# Patient Record
Sex: Male | Born: 1960 | Race: White | Hispanic: No | Marital: Married
Health system: Southern US, Community
[De-identification: ages and names within clinical notes are randomized; demographics above are authoritative.]

---

## 2000-11-30 ENCOUNTER — Ambulatory Visit (HOSPITAL_BASED_OUTPATIENT_CLINIC_OR_DEPARTMENT_OTHER): Admission: RE | Admit: 2000-11-30 | Discharge: 2000-11-30 | Payer: Self-pay | Admitting: Urology

## 2001-02-24 ENCOUNTER — Emergency Department (HOSPITAL_COMMUNITY): Admission: EM | Admit: 2001-02-24 | Discharge: 2001-02-24 | Payer: Self-pay | Admitting: Emergency Medicine

## 2015-03-02 ENCOUNTER — Other Ambulatory Visit (HOSPITAL_COMMUNITY): Payer: Self-pay | Admitting: Sports Medicine

## 2015-03-02 ENCOUNTER — Ambulatory Visit (HOSPITAL_COMMUNITY)
Admission: RE | Admit: 2015-03-02 | Discharge: 2015-03-02 | Disposition: A | Discharge status: Home / Self Care | Payer: BC Managed Care – PPO | Source: Ambulatory Visit | Attending: Sports Medicine | Admitting: Sports Medicine

## 2015-03-02 DIAGNOSIS — S46101A Unspecified injury of muscle, fascia and tendon of long head of biceps, right arm, initial encounter: Secondary | ICD-10-CM

## 2015-03-02 DIAGNOSIS — Z1389 Encounter for screening for other disorder: Secondary | ICD-10-CM | POA: Insufficient documentation

## 2017-09-04 ENCOUNTER — Encounter: Payer: Self-pay | Admitting: Physician Assistant

## 2021-02-11 ENCOUNTER — Other Ambulatory Visit (HOSPITAL_BASED_OUTPATIENT_CLINIC_OR_DEPARTMENT_OTHER): Payer: Self-pay | Admitting: Nurse Practitioner

## 2021-02-11 DIAGNOSIS — Z8249 Family history of ischemic heart disease and other diseases of the circulatory system: Secondary | ICD-10-CM

## 2021-02-11 DIAGNOSIS — J329 Chronic sinusitis, unspecified: Secondary | ICD-10-CM

## 2021-07-04 ENCOUNTER — Telehealth: Payer: Self-pay | Admitting: Hematology

## 2021-07-04 NOTE — Telephone Encounter (Signed)
Scheduled appt per 11/28 referral. Pt is aware of appt date and time. I scheduled pt for next available that pt was able to come in.

## 2021-08-04 ENCOUNTER — Inpatient Hospital Stay: Payer: BC Managed Care – PPO

## 2021-08-04 ENCOUNTER — Other Ambulatory Visit: Payer: Self-pay

## 2021-08-04 ENCOUNTER — Inpatient Hospital Stay: Payer: BC Managed Care – PPO | Attending: Hematology | Admitting: Hematology

## 2021-08-04 VITALS — BP 111/78 | HR 73 | Temp 97.7°F | Resp 18 | Wt 177.1 lb

## 2021-08-04 DIAGNOSIS — R894 Abnormal immunological findings in specimens from other organs, systems and tissues: Secondary | ICD-10-CM | POA: Insufficient documentation

## 2021-08-04 DIAGNOSIS — D127 Benign neoplasm of rectosigmoid junction: Secondary | ICD-10-CM | POA: Insufficient documentation

## 2021-08-04 DIAGNOSIS — Z79899 Other long term (current) drug therapy: Secondary | ICD-10-CM | POA: Insufficient documentation

## 2021-08-04 DIAGNOSIS — L409 Psoriasis, unspecified: Secondary | ICD-10-CM | POA: Insufficient documentation

## 2021-08-04 DIAGNOSIS — Z86010 Personal history of colonic polyps: Secondary | ICD-10-CM | POA: Diagnosis not present

## 2021-08-04 DIAGNOSIS — D479 Neoplasm of uncertain behavior of lymphoid, hematopoietic and related tissue, unspecified: Secondary | ICD-10-CM | POA: Diagnosis not present

## 2021-08-04 DIAGNOSIS — C8590 Non-Hodgkin lymphoma, unspecified, unspecified site: Secondary | ICD-10-CM

## 2021-08-04 DIAGNOSIS — J309 Allergic rhinitis, unspecified: Secondary | ICD-10-CM | POA: Insufficient documentation

## 2021-08-04 LAB — CMP (CANCER CENTER ONLY)
ALT: 16 U/L (ref 0–44)
AST: 17 U/L (ref 15–41)
Albumin: 4.3 g/dL (ref 3.5–5.0)
Alkaline Phosphatase: 61 U/L (ref 38–126)
Anion gap: 5 (ref 5–15)
BUN: 15 mg/dL (ref 6–20)
CO2: 28 mmol/L (ref 22–32)
Calcium: 9.4 mg/dL (ref 8.9–10.3)
Chloride: 106 mmol/L (ref 98–111)
Creatinine: 1.12 mg/dL (ref 0.61–1.24)
GFR, Estimated: >60 mL/min (ref >60)
Glucose, Bld: 92 mg/dL (ref 70–99)
Potassium: 4.1 mmol/L (ref 3.5–5.1)
Sodium: 139 mmol/L (ref 135–145)
Total Bilirubin: 0.3 mg/dL (ref 0.3–1.2)
Total Protein: 7.2 g/dL (ref 6.5–8.1)

## 2021-08-04 LAB — CBC WITH DIFFERENTIAL/PLATELET
Abs Immature Granulocytes: 0.01 10*3/uL (ref 0.00–0.07)
Basophils Absolute: 0 10*3/uL (ref 0.0–0.1)
Basophils Relative: 0 %
Eosinophils Absolute: 0.2 10*3/uL (ref 0.0–0.5)
Eosinophils Relative: 2 %
HCT: 45 % (ref 39.0–52.0)
Hemoglobin: 14.5 g/dL (ref 13.0–17.0)
Immature Granulocytes: 0 %
Lymphocytes Relative: 22 %
Lymphs Abs: 1.6 10*3/uL (ref 0.7–4.0)
MCH: 28.4 pg (ref 26.0–34.0)
MCHC: 32.2 g/dL (ref 30.0–36.0)
MCV: 88.2 fL (ref 80.0–100.0)
Monocytes Absolute: 0.6 10*3/uL (ref 0.1–1.0)
Monocytes Relative: 9 %
Neutro Abs: 4.7 10*3/uL (ref 1.7–7.7)
Neutrophils Relative %: 67 %
Platelets: 179 10*3/uL (ref 150–400)
RBC: 5.1 MIL/uL (ref 4.22–5.81)
RDW: 13.5 % (ref 11.5–15.5)
WBC: 7.1 10*3/uL (ref 4.0–10.5)
nRBC: 0 % (ref 0.0–0.2)

## 2021-08-04 LAB — LACTATE DEHYDROGENASE: LDH: 129 U/L (ref 98–192)

## 2021-08-04 LAB — HIV ANTIBODY (ROUTINE TESTING W REFLEX): HIV Screen 4th Generation wRfx: NONREACTIVE

## 2021-08-04 LAB — HEPATITIS B SURFACE ANTIGEN: Hepatitis B Surface Ag: NONREACTIVE

## 2021-08-04 LAB — HEPATITIS C ANTIBODY: HCV Ab: NONREACTIVE

## 2021-08-04 LAB — SEDIMENTATION RATE: Sed Rate: 5 mm/hr (ref 0–16)

## 2021-08-04 LAB — HEPATITIS B CORE ANTIBODY, TOTAL: Hep B Core Total Ab: NONREACTIVE

## 2021-08-04 NOTE — Progress Notes (Addendum)
HEMATOLOGY/ONCOLOGY CONSULTATION NOTE  Date of Service: 08/04/2021  Patient Care Team: Chesley Noon, MD as PCP - General (Family Medicine)  CHIEF COMPLAINTS/PURPOSE OF CONSULTATION:  ? Lymphoproliferative disorder on colonoscopic biopsy  HISTORY OF PRESENTING ILLNESS:   Jake Rangel is a wonderful 61 y.o. male who has been referred to [place] by Dr Carol Ada MD for evaluation and management of possible lymphoproliferative disorder involving the colon.  Patient is generally healthy male with history of allergic rhinitis and psoriasis that is controlled with topical steroids.  He had a routine colonoscopy with Dr. Carol Ada on 06/07/2021 which showed 4 sessile polyps measuring 2 to 10 mm in the rectum, ascending colon and the cecum.  These were removed with a cold snare resected and retrieved.  Allergy resolved from the resected polyps showed Rectal polyp -tubular adenoma no high-grade dysplasia or malignancy Cecum and ascending colon polypectomy-tubular adenomas Separate colonic mucosa with prominent lymphoid aggregates.  No high-grade epithelial dysplasia. Comment-many fragments exhibit prominent lymphoplasmacytic infiltrates including the majority of small lymphoid cells without significant mitotic activity or necrosis.  IHC were performed for possibility of lymphoproliferative disorder.  There was predominance of B cells CD20 positive. No distinct aberrant B-cell coexpression observed for CD5 or CD43. Germinal centers highlighted in stains for CD10, CD23 and BCL6 with germinal centers stain negatively for Bcl-2. No significant staining for cyclin D1 noted Plasma cells exhibit polytypic staining on in situ hybridization for immunoglobulin kappa and lambda light chains.  The morphologic and immunohistochemical's findings are nondiagnostic of a lymphoproliferative disorder. Specimen was sent to hematopathology division for further consultative evaluation.  COMMENTS The  morphologic findings raise possibility of a low-grade lymphoma.  Molecular analysis for B-cell clonality show a clonal proliferation in a polyclonal background.  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  However the repeat biopsy with tissue for flow cytometric analysis is recommended for further work-up. Molecular report:  B-cell clonality by PCR -detected.  Clonal immunoglobulin heavy chain or kappa light chain gene rearrangement detected consistent with clonal B-cell population.   Patient notes no fevers no chills no night sweats no unexpected weight loss.  No new lumps or bumps. Reports no GI bleeding.  No diarrhea.  No abdominal cramping or other focal abdominal symptoms. No other acute new focal symptoms. He notes that he is not feeling any different now than he felt 6 months or a year ago.   MEDICAL HISTORY:  Allergic rhinitis Psoriasis on topical steroids  SURGICAL HISTORY: Colonoscopy 06/07/2021 Biceps tendon repair Hernia repair Vasectomy  SOCIAL HISTORY: Social History   Socioeconomic History   Marital status: Married    Spouse name: Not on file   Number of children: Not on file   Years of education: Not on file   Highest education level: Not on file  Occupational History   Not on file  Tobacco Use   Smoking status: Not on file   Smokeless tobacco: Not on file  Substance and Sexual Activity   Alcohol use: Not on file   Drug use: Not on file   Sexual activity: Not on file  Other Topics Concern   Not on file  Social History Narrative   Not on file   Social Determinants of Health   Financial Resource Strain: Not on file  Food Insecurity: Not on file  Transportation Needs: Not on file  Physical Activity: Not on file  Stress: Not on file  Social Connections:  Not on file  Intimate Partner Violence: Not on file  Never smoker Social alcohol use   FAMILY HISTORY: Father  hypertension  ALLERGIES:  has no allergies on file.  MEDICATIONS:   bismuth subsalicylate (PEPTO BISMOL) 262 mg/15 mL oral suspension Take 15 mLs by mouth.   Clobetasol Propionate 0.05 % external spray Apply topically.   fluticasone (CUTIVATE) 0.05% cream   mometasone (ELOCON) 0.1 % cream   REVIEW OF SYSTEMS:    10 Point review of Systems was done is negative except as noted above.  PHYSICAL EXAMINATION: ECOG PERFORMANCE STATUS: 0  . Vitals:   08/04/21 1315  BP: 111/78  Pulse: 73  Resp: 18  Temp: 97.7 F (36.5 C)  SpO2: 98%   Filed Weights   08/04/21 1315  Weight: 177 lb 1.6 oz (80.3 kg)   .There is no height or weight on file to calculate BMI.  GENERAL:alert, in no acute distress and comfortable SKIN: no acute rashes, no significant lesions EYES: conjunctiva are pink and non-injected, sclera anicteric OROPHARYNX: MMM, no exudates, no oropharyngeal erythema or ulceration NECK: supple, no JVD LYMPH:  no palpable lymphadenopathy in the cervical, axillary or inguinal regions LUNGS: clear to auscultation b/l with normal respiratory effort HEART: regular rate & rhythm ABDOMEN:  normoactive bowel sounds , non tender, not distended.  No palpable hepatosplenomegaly Extremity: no pedal edema PSYCH: alert & oriented x 3 with fluent speech NEURO: no focal motor/sensory deficits  LABORATORY DATA:  I have reviewed the data as listed  . CBC Latest Ref Rng & Units 08/04/2021  WBC 4.0 - 10.5 K/uL 7.1  Hemoglobin 13.0 - 17.0 g/dL 14.5  Hematocrit 39.0 - 52.0 % 45.0  Platelets 150 - 400 K/uL 179    . CMP Latest Ref Rng & Units 08/04/2021  Glucose 70 - 99 mg/dL 92  BUN 6 - 20 mg/dL 15  Creatinine 0.61 - 1.24 mg/dL 1.12  Sodium 135 - 145 mmol/L 139  Potassium 3.5 - 5.1 mmol/L 4.1  Chloride 98 - 111 mmol/L 106  CO2 22 - 32 mmol/L 28  Calcium 8.9 - 10.3 mg/dL 9.4  Total Protein 6.5 - 8.1 g/dL 7.2  Total Bilirubin 0.3 - 1.2 mg/dL 0.3  Alkaline Phos 38 - 126 U/L 61  AST 15 -  41 U/L 17  ALT 0 - 44 U/L 16   . Lab Results  Component Value Date   LDH 129 08/04/2021   Component     Latest Ref Rng & Units 08/04/2021  Hep B Core Total Ab     NON REACTIVE NON REACTIVE  Hepatitis B Surface Ag     NON REACTIVE NON REACTIVE  Sed Rate     0 - 16 mm/hr 5  HIV Screen 4th Generation wRfx     Non Reactive Non Reactive  HCV Ab     NON REACTIVE NON REACTIVE    RADIOGRAPHIC STUDIES: I have personally reviewed the radiological images as listed and agreed with the findings in the report. No results found.   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 This was incidentally picked up during a routine colonoscopy and polypectomy. No visible abnormalities in the colonic mucosa reported on colonoscopy. Patient has no focal GI symptoms. No constitutional symptoms. PLAN -Patient's colonoscopy and pathology results were reviewed in details. -It shows a clonal B-cell population on molecular studies within the background of a polyclonal inflammatory process. -It is uncertain  if there is extranodal marginal zone lymphoma is causing any other systemic involvement with secondary colonic involvement or is only a localized process. -We got labs today which show normal CBC and CMP and normal LDH. -He does not have any other obvious focal symptoms, peripheral lymphadenopathy or overt hepatosplenomegaly. -We will need to get a PET CT scan to restage his newly diagnosed non-Hodgkin's lymphoma. -If he does not have any significant systemic involvement would recommend repeat GI work-up in about 6 months to a year to reevaluate his colon and potentially upper GI tract. -If any systemic involvement is noted on PET CT scan he might need additional work-up with possible biopsies of any enlarged lymph nodes follow-up bone marrow biopsy to complete the staging process and establish definitive  diagnosis.   Follow-up Labs today PET CT scan in 1 week Return to clinic with Dr. Irene Limbo in 3 weeks  All of the patients questions were answered with apparent satisfaction. The patient knows to call the clinic with any problems, questions or concerns.  I spent 30 mins counseling the patient face to face. The total time spent in the appointment was 45 mins including reviewing his available records, obtaining and reviewing his colonoscopy and pathology results in details, coordination of care with GI and documentation.  Sullivan Lone MD [place] [facility] Hematology/Oncology Physician [facility]

## 2021-08-14 NOTE — Addendum Note (Signed)
Addended by: Sullivan Lone on: 08/14/2021 11:02 PM   Modules accepted: Orders, Level of Service

## 2021-08-15 NOTE — Progress Notes (Unsigned)
PA requested for PET scan. PET scheduled for 08/24/21 at 11 am. Pt to arrive at [facility] at 1030 am. Pt notified of date and time, NPO 6 hours prior. Pt acknowledged and verbalized understanding.

## 2021-08-24 ENCOUNTER — Other Ambulatory Visit: Payer: Self-pay

## 2021-08-24 ENCOUNTER — Encounter (HOSPITAL_COMMUNITY)
Admission: RE | Admit: 2021-08-24 | Discharge: 2021-08-24 | Disposition: A | Discharge status: Home / Self Care | Payer: BC Managed Care – PPO | Source: Ambulatory Visit | Attending: Hematology | Admitting: Hematology

## 2021-08-24 DIAGNOSIS — C8590 Non-Hodgkin lymphoma, unspecified, unspecified site: Secondary | ICD-10-CM | POA: Diagnosis present

## 2021-08-24 LAB — GLUCOSE, CAPILLARY: Glucose-Capillary: 96 mg/dL (ref 70–99)

## 2021-08-24 MED ORDER — FLUDEOXYGLUCOSE F - 18 (FDG) INJECTION
8.8400 | Freq: Once | INTRAVENOUS | Status: AC | PRN
  Administered 2021-08-24: 11:00:00 8.84 via INTRAVENOUS

## 2021-08-31 ENCOUNTER — Telehealth: Payer: Self-pay | Admitting: Hematology

## 2021-08-31 NOTE — Telephone Encounter (Signed)
Sch per 1/31 inbasket, pt aware

## 2021-09-02 ENCOUNTER — Inpatient Hospital Stay: Payer: BC Managed Care – PPO | Attending: Hematology | Admitting: Hematology

## 2021-09-02 ENCOUNTER — Other Ambulatory Visit: Payer: Self-pay

## 2021-09-02 VITALS — BP 108/67 | HR 72 | Temp 97.0°F | Resp 18 | Wt 177.8 lb

## 2021-09-02 DIAGNOSIS — C8590 Non-Hodgkin lymphoma, unspecified, unspecified site: Secondary | ICD-10-CM

## 2021-09-02 DIAGNOSIS — D479 Neoplasm of uncertain behavior of lymphoid, hematopoietic and related tissue, unspecified: Secondary | ICD-10-CM | POA: Diagnosis not present

## 2021-09-08 NOTE — Progress Notes (Addendum)
HEMATOLOGY/ONCOLOGY CLINIC NOTE  Date of Service: 09/19/2021  Patient Care Team: Chesley Noon, MD as PCP - General (Family Medicine)  CHIEF COMPLAINTS/PURPOSE OF CONSULTATION:  ? Lymphoproliferative disorder on colonoscopic biopsy  HISTORY OF PRESENTING ILLNESS:   Jake Rangel is a wonderful 61 y.o. male who has been referred to [place] by Dr Carol Ada MD for evaluation and management of possible lymphoproliferative disorder involving the colon.  Patient is generally healthy male with history of allergic rhinitis and psoriasis that is controlled with topical steroids.  He had a routine colonoscopy with Dr. Carol Ada on 06/07/2021 which showed 4 sessile polyps measuring 2 to 10 mm in the rectum, ascending colon and the cecum.  These were removed with a cold snare resected and retrieved.  Allergy resolved from the resected polyps showed Rectal polyp -tubular adenoma no high-grade dysplasia or malignancy Cecum and ascending colon polypectomy-tubular adenomas Separate colonic mucosa with prominent lymphoid aggregates.  No high-grade epithelial dysplasia. Comment-many fragments exhibit prominent lymphoplasmacytic infiltrates including the majority of small lymphoid cells without significant mitotic activity or necrosis.  IHC were performed for possibility of lymphoproliferative disorder.  There was predominance of B cells CD20 positive. No distinct aberrant B-cell coexpression observed for CD5 or CD43. Germinal centers highlighted in stains for CD10, CD23 and BCL6 with germinal centers stain negatively for Bcl-2. No significant staining for cyclin D1 noted Plasma cells exhibit polytypic staining on in situ hybridization for immunoglobulin kappa and lambda light chains.  The morphologic and immunohistochemical's findings are nondiagnostic of a lymphoproliferative disorder. Specimen was sent to hematopathology division for further consultative evaluation.  COMMENTS The morphologic  findings raise possibility of a low-grade lymphoma.  Molecular analysis for B-cell clonality show a clonal proliferation in a polyclonal background.  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  However the repeat biopsy with tissue for flow cytometric analysis is recommended for further work-up. Molecular report:  B-cell clonality by PCR -detected.  Clonal immunoglobulin heavy chain or kappa light chain gene rearrangement detected consistent with clonal B-cell population.   Patient notes no fevers no chills no night sweats no unexpected weight loss.  No new lumps or bumps. Reports no GI bleeding.  No diarrhea.  No abdominal cramping or other focal abdominal symptoms. No other acute new focal symptoms. He notes that he is not feeling any different now than he felt 6 months or a year ago.  INTERVAL HISTORY  [name] is here for follow-up to discuss the results of his lab and PET/CT work-up. He notes no new symptoms since his last clinic visit. We discussed his available pathology results from his colonoscopy which shows possible low-grade MALT lymphoma in his colon versus chronic reactive changes.  Was B-cell clonality positive by gene rearrangement studies.  His PET CT scan done on 08/24/2021 showed no findings suggestive of active lymphoma.  Normal spleen and bone marrow.  No lymphadenopathy.  No overt evidence for mass in the colon small bowel as per PET scan.  Labs done 08/04/2021 showed normal CBC, normal LDH level of 129, sed rate of 5 ,HIV nonreactive hepatitis C nonreactive, CMP within normal limits.  MEDICAL HISTORY:  Allergic rhinitis Psoriasis on topical steroids  SURGICAL HISTORY: Colonoscopy 06/07/2021 Biceps tendon repair Hernia repair Vasectomy  SOCIAL HISTORY: Social History   Socioeconomic History   Marital status: Married    Spouse name: Not on file   Number of children: Not on file  Years of  education: Not on file   Highest education level: Not on file  Occupational History   Not on file  Tobacco Use   Smoking status: Not on file   Smokeless tobacco: Not on file  Substance and Sexual Activity   Alcohol use: Not on file   Drug use: Not on file   Sexual activity: Not on file  Other Topics Concern   Not on file  Social History Narrative   Not on file   Social Determinants of Health   Financial Resource Strain: Not on file  Food Insecurity: Not on file  Transportation Needs: Not on file  Physical Activity: Not on file  Stress: Not on file  Social Connections: Not on file  Intimate Partner Violence: Not on file  Never smoker Social alcohol use   FAMILY HISTORY: Father hypertension  ALLERGIES:  has no allergies on file.  MEDICATIONS:   bismuth subsalicylate (PEPTO BISMOL) 262 mg/15 mL oral suspension Take 15 mLs by mouth.   Clobetasol Propionate 0.05 % external spray Apply topically.   fluticasone (CUTIVATE) 0.05% cream   mometasone (ELOCON) 0.1 % cream   REVIEW OF SYSTEMS:    .10 Point review of Systems was done is negative except as noted above.  PHYSICAL EXAMINATION: ECOG PERFORMANCE STATUS: 0  . Vitals:   09/02/21 1015  BP: 108/67  Pulse: 72  Resp: 18  Temp: (!) 97 F (36.1 C)  SpO2: 99%   Filed Weights   09/02/21 1015  Weight: 177 lb 12.8 oz (80.6 kg)   .There is no height or weight on file to calculate BMI.  Marland Kitchen GENERAL:alert, in no acute distress and comfortable SKIN: no acute rashes, no significant lesions EYES: conjunctiva are pink and non-injected, sclera anicteric OROPHARYNX: MMM, no exudates, no oropharyngeal erythema or ulceration NECK: supple, no JVD LYMPH:  no palpable lymphadenopathy in the cervical, axillary or inguinal regions LUNGS: clear to auscultation b/l with normal respiratory effort HEART: regular rate & rhythm ABDOMEN:  normoactive bowel sounds , non tender, not distended. Extremity: no pedal edema PSYCH: alert &  oriented x 3 with fluent speech NEURO: no focal motor/sensory deficits   LABORATORY DATA:  I have reviewed the data as listed  . CBC Latest Ref Rng & Units 08/04/2021  WBC 4.0 - 10.5 K/uL 7.1  Hemoglobin 13.0 - 17.0 g/dL 14.5  Hematocrit 39.0 - 52.0 % 45.0  Platelets 150 - 400 K/uL 179    . CMP Latest Ref Rng & Units 08/04/2021  Glucose 70 - 99 mg/dL 92  BUN 6 - 20 mg/dL 15  Creatinine 0.61 - 1.24 mg/dL 1.12  Sodium 135 - 145 mmol/L 139  Potassium 3.5 - 5.1 mmol/L 4.1  Chloride 98 - 111 mmol/L 106  CO2 22 - 32 mmol/L 28  Calcium 8.9 - 10.3 mg/dL 9.4  Total Protein 6.5 - 8.1 g/dL 7.2  Total Bilirubin 0.3 - 1.2 mg/dL 0.3  Alkaline Phos 38 - 126 U/L 61  AST 15 - 41 U/L 17  ALT 0 - 44 U/L 16   . Lab Results  Component Value Date   LDH 129 08/04/2021   Component     Latest Ref Rng & Units 08/04/2021  Hep B Core Total Ab     NON REACTIVE NON REACTIVE  Hepatitis B Surface Ag     NON REACTIVE NON REACTIVE  Sed Rate     0 - 16 mm/hr 5  HIV Screen 4th Generation wRfx  Non Reactive Non Reactive  HCV Ab     NON REACTIVE NON REACTIVE    RADIOGRAPHIC STUDIES: I have personally reviewed the radiological images as listed and agreed with the findings in the report. NM PET Image Initial (PI) Skull Base To Thigh  Result Date: 08/25/2021 CLINICAL DATA:  Initial treatment strategy for non-Hodgkin's lymphoma. Lymphoma discovered on colonoscopy November 2022. EXAM: NUCLEAR MEDICINE PET SKULL BASE TO THIGH TECHNIQUE: 8.8 mCi F-18 FDG was injected intravenously. Full-ring PET imaging was performed from the skull base to thigh after the radiotracer. CT data was obtained and used for attenuation correction and anatomic localization. Fasting blood glucose: 96 mg/dl COMPARISON:  None. FINDINGS: Mediastinal blood pool activity: SUV max 2.4 Liver activity: SUV max NA NECK: No hypermetabolic lymph nodes in the neck. Incidental CT findings: none CHEST: No hypermetabolic mediastinal or hilar nodes.  No suspicious pulmonary nodules on the CT scan. Incidental CT findings: none ABDOMEN/PELVIS: No abnormal hypermetabolic activity within the liver, pancreas, adrenal glands, or spleen. No hypermetabolic lymph nodes in the abdomen or pelvis. No focal metabolic activity associated with the colon. Colon mass or obstruction. Terminal ileum normal. Incidental CT findings: Several low-density lesions in liver favored benign cysts SKELETON: No focal hypermetabolic activity to suggest skeletal metastasis. Incidental CT findings: none IMPRESSION: 1. No evidence of lymphoma of the colon or terminal ileum. No obstruction or mass. 2. No lymphadenopathy. 3. Normal spleen and bone marrow. Electronically Signed   By: Suzy Bouchard M.D.   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 This was incidentally picked up during a routine colonoscopy and polypectomy. No visible abnormalities in the colonic mucosa reported on colonoscopy. Patient has no focal GI symptoms. No constitutional symptoms. PLAN -Patient has no clinical signs or symptoms of non-Hodgkin's lymphoma at this time. -He has no GI symptoms nausea vomiting or diarrhea.  No GI bleeding. -Findings of his colonoscopy biopsy that incidentally noted a possible low-grade lymphoproliferative disorder versus chronic inflammation were discussed in details.  Cannot rule out colonic involvement by marginal zone lymphoma. -PET CT scan shows no evidence of lymphoma. -Patient's blood test showed normal CBC CMP and normal LDH level -We discussed that even if this is marginal zone lymphoma involving the colon in the absence of symptoms, constitutional symptoms, blood count abnormalities, bulky disease or significant involvement on PET scan it might still be appropriate to monitor him. -He has no GI symptoms and strongly prefers to take a conservative  route. -No clear indication for treatment at this time -Would recommend he follow-up with his gastroenterologist Dr. Carol Ada for repeat colonoscopy in about 10 to 11 months to evaluate for any interval progression and to see his back in 12 months with labs.  Follow-up RTC with Dr Irene Limbo with labs in 12 months  All of the patients questions were answered with apparent satisfaction. The patient knows to call the clinic with any problems, questions or concerns.  Sullivan Lone MD [place] [facility] Hematology/Oncology Physician [facility]

## 2021-12-23 ENCOUNTER — Telehealth: Payer: Self-pay

## 2021-12-23 NOTE — Telephone Encounter (Signed)
Returned call to pt's wife regarding change in pt. Pt having loose stools after each meal for the past 2 weeks. Pt currently being evaluated by PCP/ had blood work today. Encouraged wife to call back with update and if they are not able to resolve issue, we can bring pt in to be seen. Pt's wife verbalized understanding.

## 2022-05-22 ENCOUNTER — Other Ambulatory Visit (HOSPITAL_COMMUNITY): Payer: Self-pay | Admitting: Gastroenterology

## 2022-05-22 DIAGNOSIS — R1031 Right lower quadrant pain: Secondary | ICD-10-CM

## 2022-05-22 DIAGNOSIS — R1032 Left lower quadrant pain: Secondary | ICD-10-CM

## 2022-05-26 ENCOUNTER — Ambulatory Visit (HOSPITAL_COMMUNITY)
Admission: RE | Admit: 2022-05-26 | Discharge: 2022-05-26 | Disposition: A | Discharge status: Home / Self Care | Payer: BC Managed Care – PPO | Source: Ambulatory Visit | Attending: Gastroenterology | Admitting: Gastroenterology

## 2022-05-26 DIAGNOSIS — R1032 Left lower quadrant pain: Secondary | ICD-10-CM | POA: Insufficient documentation

## 2022-05-26 DIAGNOSIS — R1031 Right lower quadrant pain: Secondary | ICD-10-CM | POA: Diagnosis present

## 2022-05-26 DIAGNOSIS — C8590 Non-Hodgkin lymphoma, unspecified, unspecified site: Secondary | ICD-10-CM | POA: Diagnosis present

## 2022-05-26 LAB — POCT I-STAT CREATININE: Creatinine, Ser: 1.3 mg/dL — ABNORMAL HIGH (ref 0.61–1.24)

## 2022-05-26 MED ORDER — IOHEXOL 300 MG/ML  SOLN
100.0000 mL | Freq: Once | INTRAMUSCULAR | Status: AC | PRN
  Administered 2022-05-26: 100 mL via INTRAVENOUS

## 2022-05-26 MED ORDER — SODIUM CHLORIDE (PF) 0.9 % IJ SOLN
INTRAMUSCULAR | Status: AC
  Filled 2022-05-26: qty 50

## 2022-08-31 ENCOUNTER — Other Ambulatory Visit: Payer: Self-pay

## 2022-08-31 DIAGNOSIS — C8590 Non-Hodgkin lymphoma, unspecified, unspecified site: Secondary | ICD-10-CM

## 2022-09-01 ENCOUNTER — Inpatient Hospital Stay: Payer: BC Managed Care – PPO | Attending: Hematology

## 2022-09-01 ENCOUNTER — Other Ambulatory Visit: Payer: Self-pay

## 2022-09-01 ENCOUNTER — Inpatient Hospital Stay (HOSPITAL_BASED_OUTPATIENT_CLINIC_OR_DEPARTMENT_OTHER): Payer: BC Managed Care – PPO | Admitting: Hematology

## 2022-09-01 VITALS — BP 115/62 | HR 74 | Temp 98.0°F | Resp 18 | Ht 68.0 in | Wt 181.8 lb

## 2022-09-01 DIAGNOSIS — C8590 Non-Hodgkin lymphoma, unspecified, unspecified site: Secondary | ICD-10-CM

## 2022-09-01 DIAGNOSIS — C884 Extranodal marginal zone B-cell lymphoma of mucosa-associated lymphoid tissue [MALT-lymphoma]: Secondary | ICD-10-CM | POA: Diagnosis present

## 2022-09-01 LAB — CMP (CANCER CENTER ONLY)
ALT: 14 U/L (ref 0–44)
AST: 16 U/L (ref 15–41)
Albumin: 4 g/dL (ref 3.5–5.0)
Alkaline Phosphatase: 59 U/L (ref 38–126)
Anion gap: 4 — ABNORMAL LOW (ref 5–15)
BUN: 18 mg/dL (ref 6–20)
CO2: 30 mmol/L (ref 22–32)
Calcium: 8.9 mg/dL (ref 8.9–10.3)
Chloride: 107 mmol/L (ref 98–111)
Creatinine: 1.14 mg/dL (ref 0.61–1.24)
GFR, Estimated: >60 mL/min (ref >60)
Glucose, Bld: 110 mg/dL — ABNORMAL HIGH (ref 70–99)
Potassium: 4.2 mmol/L (ref 3.5–5.1)
Sodium: 141 mmol/L (ref 135–145)
Total Bilirubin: 0.4 mg/dL (ref 0.3–1.2)
Total Protein: 6.3 g/dL — ABNORMAL LOW (ref 6.5–8.1)

## 2022-09-01 LAB — CBC WITH DIFFERENTIAL (CANCER CENTER ONLY)
Abs Immature Granulocytes: 0.01 10*3/uL (ref 0.00–0.07)
Basophils Absolute: 0 10*3/uL (ref 0.0–0.1)
Basophils Relative: 1 %
Eosinophils Absolute: 0.2 10*3/uL (ref 0.0–0.5)
Eosinophils Relative: 4 %
HCT: 48.1 % (ref 39.0–52.0)
Hemoglobin: 16.1 g/dL (ref 13.0–17.0)
Immature Granulocytes: 0 %
Lymphocytes Relative: 29 %
Lymphs Abs: 1.3 10*3/uL (ref 0.7–4.0)
MCH: 29.5 pg (ref 26.0–34.0)
MCHC: 33.5 g/dL (ref 30.0–36.0)
MCV: 88.3 fL (ref 80.0–100.0)
Monocytes Absolute: 0.4 10*3/uL (ref 0.1–1.0)
Monocytes Relative: 9 %
Neutro Abs: 2.5 10*3/uL (ref 1.7–7.7)
Neutrophils Relative %: 57 %
Platelet Count: 167 10*3/uL (ref 150–400)
RBC: 5.45 MIL/uL (ref 4.22–5.81)
RDW: 13.2 % (ref 11.5–15.5)
WBC Count: 4.4 10*3/uL (ref 4.0–10.5)
nRBC: 0 % (ref 0.0–0.2)

## 2022-09-01 LAB — LACTATE DEHYDROGENASE: LDH: 121 U/L (ref 98–192)

## 2022-09-01 NOTE — Progress Notes (Signed)
HEMATOLOGY/ONCOLOGY CLINIC NOTE  Date of Service: 09/01/2022  Patient Care Team: Chesley Noon, MD as PCP - General (Family Medicine)  CHIEF COMPLAINTS/PURPOSE OF CONSULTATION:  ? Lymphoproliferative disorder on colonoscopic biopsy  HISTORY OF PRESENTING ILLNESS:   Jake Rangel is a wonderful 62 y.o. male who has been referred to [place] by Dr Carol Ada MD for evaluation and management of possible lymphoproliferative disorder involving the colon.  Patient is generally healthy male with history of allergic rhinitis and psoriasis that is controlled with topical steroids.  He had a routine colonoscopy with Dr. Carol Ada on 06/07/2021 which showed 4 sessile polyps measuring 2 to 10 mm in the rectum, ascending colon and the cecum.  These were removed with a cold snare resected and retrieved.  Allergy resolved from the resected polyps showed Rectal polyp -tubular adenoma no high-grade dysplasia or malignancy Cecum and ascending colon polypectomy-tubular adenomas Separate colonic mucosa with prominent lymphoid aggregates.  No high-grade epithelial dysplasia. Comment-many fragments exhibit prominent lymphoplasmacytic infiltrates including the majority of small lymphoid cells without significant mitotic activity or necrosis.  IHC were performed for possibility of lymphoproliferative disorder.  There was predominance of B cells CD20 positive. No distinct aberrant B-cell coexpression observed for CD5 or CD43. Germinal centers highlighted in stains for CD10, CD23 and BCL6 with germinal centers stain negatively for Bcl-2. No significant staining for cyclin D1 noted Plasma cells exhibit polytypic staining on in situ hybridization for immunoglobulin kappa and lambda light chains.  The morphologic and immunohistochemical's findings are nondiagnostic of a lymphoproliferative disorder. Specimen was sent to hematopathology division for further consultative evaluation.  COMMENTS The morphologic  findings raise possibility of a low-grade lymphoma.  Molecular analysis for B-cell clonality show a clonal proliferation in a polyclonal background.  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  However the repeat biopsy with tissue for flow cytometric analysis is recommended for further work-up. Molecular report:  B-cell clonality by PCR -detected.  Clonal immunoglobulin heavy chain or kappa light chain gene rearrangement detected consistent with clonal B-cell population.   Patient notes no fevers no chills no night sweats no unexpected weight loss.  No new lumps or bumps. Reports no GI bleeding.  No diarrhea.  No abdominal cramping or other focal abdominal symptoms. No other acute new focal symptoms. He notes that he is not feeling any different now than he felt 6 months or a year ago.  INTERVAL HISTORY  Jake Rangel is here for follow-up to discuss the results of his lab and PET/CT work-up.  Patient was last seen by me on 09/02/21 and he was doing well overall.  Today, he complains of constant constipation. His abdominal pain has improved. He reports that his colonoscopy on December 27th, 2023 showed no concerns but official colonoscoy and bx results have been requested.  He complains that since December, he has experienced chills and that his feet are consistently cold.  He reports that he does not generally follow up with his PCP and was last was seen 5 years ago.  He denies any new lumps/bumps, abdominal pain, fevers, night sweats, sudden weight loss or sudden fatigue. No infection issues or new medications.  MEDICAL HISTORY:  Allergic rhinitis Psoriasis on topical steroids  SURGICAL HISTORY: Colonoscopy 06/07/2021 Biceps tendon repair Hernia repair Vasectomy  SOCIAL HISTORY: Social History   Socioeconomic History   Marital status: Married    Spouse name: Not on file   Number of children:  Not on file   Years of  education: Not on file   Highest education level: Not on file  Occupational History   Not on file  Tobacco Use   Smoking status: Not on file   Smokeless tobacco: Not on file  Substance and Sexual Activity   Alcohol use: Not on file   Drug use: Not on file   Sexual activity: Not on file  Other Topics Concern   Not on file  Social History Narrative   Not on file   Social Determinants of Health   Financial Resource Strain: Not on file  Food Insecurity: Not on file  Transportation Needs: Not on file  Physical Activity: Not on file  Stress: Not on file  Social Connections: Not on file  Intimate Partner Violence: Not on file  Never smoker Social alcohol use   FAMILY HISTORY: Father hypertension  ALLERGIES:  has no allergies on file.  MEDICATIONS:   bismuth subsalicylate (PEPTO BISMOL) 262 mg/15 mL oral suspension Take 15 mLs by mouth.   Clobetasol Propionate 0.05 % external spray Apply topically.   fluticasone (CUTIVATE) 0.05% cream   mometasone (ELOCON) 0.1 % cream   REVIEW OF SYSTEMS:    10 Point review of Systems was done is negative except as noted above.   PHYSICAL EXAMINATION: ECOG PERFORMANCE STATUS: 0  . Vitals:   09/01/22 0847  BP: 115/62  Pulse: 74  Resp: 18  Temp: 98 F (36.7 C)  SpO2: 98%    Filed Weights   09/01/22 0847  Weight: 181 lb 12.8 oz (82.5 kg)    .Body mass index is 27.64 kg/m.  Marland Kitchen  GENERAL:alert, in no acute distress and comfortable SKIN: no acute rashes, no significant lesions EYES: conjunctiva are pink and non-injected, sclera anicteric OROPHARYNX: MMM, no exudates, no oropharyngeal erythema or ulceration NECK: supple, no JVD LYMPH:  no palpable lymphadenopathy in the cervical, axillary or inguinal regions LUNGS: clear to auscultation b/l with normal respiratory effort HEART: regular rate & rhythm ABDOMEN:  normoactive bowel sounds , non tender, not distended. Extremity: no pedal edema PSYCH: alert & oriented x 3 with  fluent speech NEURO: no focal motor/sensory deficits    LABORATORY DATA:  I have reviewed the data as listed  .    Latest Ref Rng & Units 08/04/2021    2:52 PM  CBC  WBC 4.0 - 10.5 K/uL 7.1   Hemoglobin 13.0 - 17.0 g/dL 14.5   Hematocrit 39.0 - 52.0 % 45.0   Platelets 150 - 400 K/uL 179     .    Latest Ref Rng & Units 05/26/2022    8:37 AM 08/04/2021    2:52 PM  CMP  Glucose 70 - 99 mg/dL  92   BUN 6 - 20 mg/dL  15   Creatinine 0.61 - 1.24 mg/dL 1.30  1.12   Sodium 135 - 145 mmol/L  139   Potassium 3.5 - 5.1 mmol/L  4.1   Chloride 98 - 111 mmol/L  106   CO2 22 - 32 mmol/L  28   Calcium 8.9 - 10.3 mg/dL  9.4   Total Protein 6.5 - 8.1 g/dL  7.2   Total Bilirubin 0.3 - 1.2 mg/dL  0.3   Alkaline Phos 38 - 126 U/L  61   AST 15 - 41 U/L  17   ALT 0 - 44 U/L  16    . Lab Results  Component Value Date   LDH 129 08/04/2021   Component  Latest Ref Rng & Units 08/04/2021  Hep B Core Total Ab     NON REACTIVE NON REACTIVE  Hepatitis B Surface Ag     NON REACTIVE NON REACTIVE  Sed Rate     0 - 16 mm/hr 5  HIV Screen 4th Generation wRfx     Non Reactive Non Reactive  HCV Ab     NON REACTIVE NON REACTIVE    RADIOGRAPHIC STUDIES: I have personally reviewed the radiological images as listed and agreed with the findings in the report. No results found.   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 This was incidentally picked up during a routine colonoscopy and polypectomy. No visible abnormalities in the colonic mucosa reported on colonoscopy. Patient has no focal GI symptoms. No constitutional symptoms.  PLAN  -Discussed lab results on 09/01/22  with patient. Blood counts normal. CBC showed WBC of 4.4 K, hemoglobin of 16.2, and platelets of 167 K. -CMP and LDH pending -CT scan from October showed no signs of lymphoma -colonoscopy unremarkable per patients report but will  obtain colonoscopy and bx results from his GI doctors office, -order labs in 1 year  Follow-up RTC with Dr Irene Limbo with labs in 12 months  The total time spent in the appointment was 20 minutes* .  All of the patient's questions were answered with apparent satisfaction. The patient knows to call the clinic with any problems, questions or concerns.   Sullivan Lone MD MS AAHIVMS [facility] Hematology/Oncology Physician [facility]  .*Total Encounter Time as defined by the Centers for Medicare and Medicaid Services includes, in addition to the face-to-face time of a patient visit (documented in the note above) non-face-to-face time: obtaining and reviewing outside history, ordering and reviewing medications, tests or procedures, care coordination (communications with other health care professionals or caregivers) and documentation in the medical record.   I,Mitra Faeizi,acting as a Education administrator for Sullivan Lone, MD.,have documented all relevant documentation on the behalf of Sullivan Lone, MD,as directed by  Sullivan Lone, MD while in the presence of Sullivan Lone, MD.  .I have reviewed the above documentation for accuracy and completeness, and I agree with the above. Brunetta Genera MD

## 2022-09-04 ENCOUNTER — Telehealth: Payer: Self-pay | Admitting: Hematology

## 2022-09-04 NOTE — Telephone Encounter (Signed)
Called patient per 2/2 los notes to schedule f/u. Patient scheduled and notified. Mailing patient reminder.

## 2022-10-19 ENCOUNTER — Telehealth: Payer: Self-pay | Admitting: Hematology

## 2022-10-19 NOTE — Telephone Encounter (Signed)
Colonoscopy report from 07/20/2022 were obtained from Dr. Ulyses Amor office  Colonoscopy showed post polypectomy scar in the cecum which was biopsied.  The scar tissue was healthy in appearance there was no evidence of the previous polyp.  Apology results from 07/20/2022 colonoscopy biopsy showed fragments of unremarkable colonic mucosa with benign lymphoid aggregate.  No malignancy identified.   No change in plan the patient will return to clinic in 1 year

## 2022-10-19 NOTE — Progress Notes (Signed)
Contacted pt per Dr Irene Limbo: to let the patient know we did finally get his colonoscopy and biopsy results and there was no sign of new colonoscopically visualizable lesions and the biopsy showed no evidence of lymphoma or other malignancy.  We shall follow him up in the 1 year as previously planned.  Pt acknowledged information and verbalized understanding.

## 2022-10-20 ENCOUNTER — Encounter: Payer: Self-pay | Admitting: Gastroenterology

## 2022-10-20 ENCOUNTER — Encounter: Payer: Self-pay | Admitting: Hematology

## 2023-01-09 IMAGING — PT NM PET TUM IMG INITIAL (PI) SKULL BASE T - THIGH
1 series · 1 of 1 positions shown · non-contrast
Comparison: None.

CLINICAL DATA: Initial treatment strategy for non-Hodgkin's
lymphoma. Lymphoma discovered on colonoscopy May 2021.

EXAM:
NUCLEAR MEDICINE PET SKULL BASE TO THIGH
TECHNIQUE: 8.8 mCi F-18 FDG was injected intravenously. Full-ring PET imaging
was performed from the skull base to thigh after the radiotracer. CT
data was obtained and used for attenuation correction and anatomic
localization.
Fasting blood glucose: 96 mg/dl

[Series 1075: results mm oncology reading · 1.0mm · 0.39mm/px · 1 of 1 slices shown]
[im 1/1]
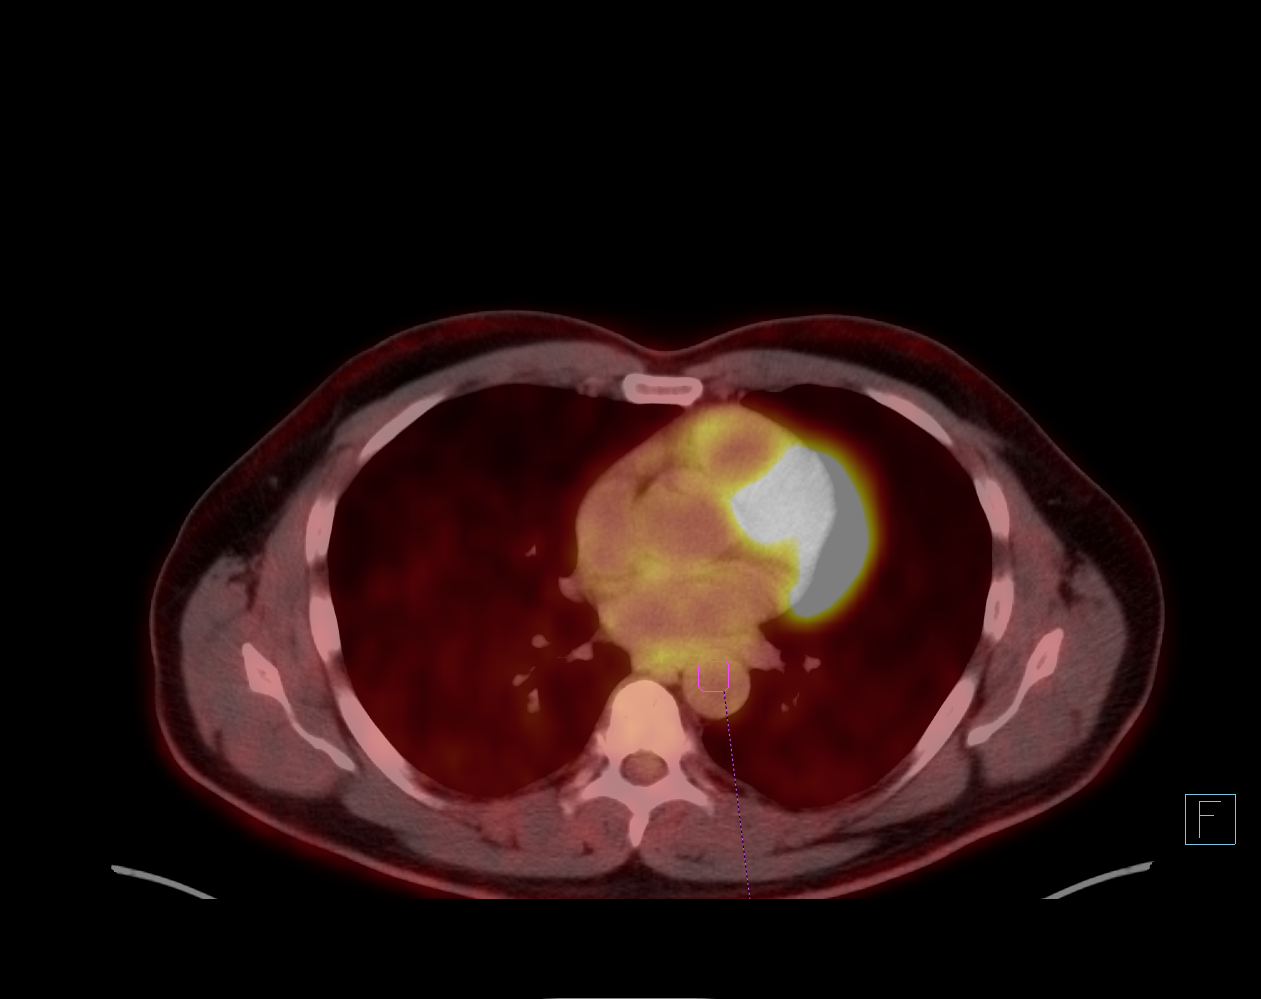

[1 of 1 positions shown; findings below may reference images not displayed]

FINDINGS: Mediastinal blood pool activity: SUV max

Liver activity: SUV max NA

NECK: No hypermetabolic lymph nodes in the neck.

Incidental CT findings: none

CHEST: No hypermetabolic mediastinal or hilar nodes. No suspicious
pulmonary nodules on the CT scan.

Incidental CT findings: none

ABDOMEN/PELVIS: No abnormal hypermetabolic activity within the
liver, pancreas, adrenal glands, or spleen. No hypermetabolic lymph
nodes in the abdomen or pelvis.

No focal metabolic activity associated with the colon. Colon mass or
obstruction. Terminal ileum normal.

Incidental CT findings: Several low-density lesions in liver favored
benign cysts

SKELETON: No focal hypermetabolic activity to suggest skeletal
metastasis.

Incidental CT findings: none
IMPRESSION: 1. No evidence of lymphoma of the colon or terminal ileum. No
obstruction or mass.
2. No lymphadenopathy.
3. Normal spleen and bone marrow.

## 2023-09-06 ENCOUNTER — Other Ambulatory Visit: Payer: Self-pay

## 2023-09-06 DIAGNOSIS — C8590 Non-Hodgkin lymphoma, unspecified, unspecified site: Secondary | ICD-10-CM

## 2023-09-06 NOTE — Progress Notes (Signed)
 HEMATOLOGY/ONCOLOGY CLINIC NOTE  Date of Service: 09/07/2023  Patient Care Team: Sophronia Ozell BROCKS, MD as PCP - General (Family Medicine)  CHIEF COMPLAINTS/PURPOSE OF CONSULTATION:  ? Lymphoproliferative disorder on colonoscopic biopsy  HISTORY OF PRESENTING ILLNESS:   Jake Rangel is a wonderful 63 y.o. male who has been referred to us  by Dr Belvie Just MD for evaluation and management of possible lymphoproliferative disorder involving the colon.  Patient is generally healthy male with history of allergic rhinitis and psoriasis that is controlled with topical steroids.  He had a routine colonoscopy with Dr. Belvie Just on 06/07/2021 which showed 4 sessile polyps measuring 2 to 10 mm in the rectum, ascending colon and the cecum.  These were removed with a cold snare resected and retrieved.  Allergy resolved from the resected polyps showed Rectal polyp -tubular adenoma no high-grade dysplasia or malignancy Cecum and ascending colon polypectomy-tubular adenomas Separate colonic mucosa with prominent lymphoid aggregates.  No high-grade epithelial dysplasia. Comment-many fragments exhibit prominent lymphoplasmacytic infiltrates including the majority of small lymphoid cells without significant mitotic activity or necrosis.  IHC were performed for possibility of lymphoproliferative disorder.  There was predominance of B cells CD20 positive. No distinct aberrant B-cell coexpression observed for CD5 or CD43. Germinal centers highlighted in stains for CD10, CD23 and BCL6 with germinal centers stain negatively for Bcl-2. No significant staining for cyclin D1 noted Plasma cells exhibit polytypic staining on in situ hybridization for immunoglobulin kappa and lambda light chains.  The morphologic and immunohistochemical's findings are nondiagnostic of a lymphoproliferative disorder. Specimen was sent to hematopathology division for further consultative evaluation.  COMMENTS The morphologic  findings raise possibility of a low-grade lymphoma.  Molecular analysis for B-cell clonality show a clonal proliferation in a polyclonal background.  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  However the repeat biopsy with tissue for flow cytometric analysis is recommended for further work-up. Molecular report:  B-cell clonality by PCR -detected.  Clonal immunoglobulin heavy chain or kappa light chain gene rearrangement detected consistent with clonal B-cell population.   Patient notes no fevers no chills no night sweats no unexpected weight loss.  No new lumps or bumps. Reports no GI bleeding.  No diarrhea.  No abdominal cramping or other focal abdominal symptoms. No other acute new focal symptoms. He notes that he is not feeling any different now than he felt 6 months or a year ago.  INTERVAL HISTORY  [name] is a 63 y.o. 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   Patient was last seen by me on 09/01/2022 and complained of constant constipation, chills, and consistently cold feet.   Today, he reports that he has been doing well over the last year. He denies developing any new symptoms. Patient denies any change in bowel habits, fever, chills, night sweats, new lumps/bumps, abdominal pain, black stools, blood in the stools, mucus in the stools, urinary changes, or changes in breathing habits such as SOB.   Patient's last colonoscopy was 06/2022. He reports that his gastroenterologist, Dr. Just, had recommended a repeat colonoscopy after 5 years.  He complains of gassiness over the last couple of years. Patient reports stable constant constipation since birth.   He reports that he generally drinks 1 bottle of water in a day, and typically drinks juice in the mornings. Patient does drink a couple glasses of tea during the day.  He  reports that he was a previously smoker, and quit smoking 27-28 years ago.   MEDICAL HISTORY:  Allergic rhinitis Psoriasis on topical steroids  SURGICAL HISTORY: Colonoscopy 06/07/2021 Biceps tendon repair Hernia repair Vasectomy  SOCIAL HISTORY: Social History   Socioeconomic History   Marital status: Married    Spouse name: Not on file   Number of children: Not on file   Years of education: Not on file   Highest education level: Not on file  Occupational History   Not on file  Tobacco Use   Smoking status: Not on file   Smokeless tobacco: Not on file  Substance and Sexual Activity   Alcohol use: Not on file   Drug use: Not on file   Sexual activity: Not on file  Other Topics Concern   Not on file  Social History Narrative   Not on file   Social Drivers of Health   Financial Resource Strain: Low Risk  (08/06/2023)   Received from [facility]   Overall Financial Resource Strain (CARDIA)    Difficulty of Paying Living Expenses: Not hard at all  Food Insecurity: No Food Insecurity (08/06/2023)   Received from [facility]   Hunger Vital Sign    Worried About Running Out of Food in the Last Year: Never true    Ran Out of Food in the Last Year: Never true  Transportation Needs: No Transportation Needs (08/06/2023)   Received from [facility] - Transportation    Lack of Transportation (Medical): No    Lack of Transportation (Non-Medical): No  Physical Activity: Inactive (02/11/2021)   [facility] from [facility]   Exercise Vital Sign    Days of Exercise per Week: 0 days    Minutes of Exercise per Session: 0 min  Stress: No Stress Concern Present (02/11/2021)   Received from [facility] - Occupational Stress Questionnaire    Feeling of Stress : Not at all  Social Connections: Unknown (12/09/2021)   Received from [facility]   Social Network    Social Network: Not on file   Intimate Partner Violence: Unknown (11/03/2021)   Received from [facility]   HITS    Physically Hurt: Not on file    Insult or Talk Down To: Not on file    Threaten Physical Harm: Not on file    Scream or Curse: Not on file  Never smoker Social alcohol use   FAMILY HISTORY: Father hypertension  ALLERGIES:  has no allergies on file.  MEDICATIONS:   bismuth subsalicylate (PEPTO BISMOL) 262 mg/15 mL oral suspension Take 15 mLs by mouth.   Clobetasol Propionate 0.05 % external spray Apply topically.   fluticasone (CUTIVATE) 0.05% cream   mometasone (ELOCON) 0.1 % cream   REVIEW OF SYSTEMS:    10 Point review of Systems was done is negative except as noted above.   PHYSICAL EXAMINATION: ECOG PERFORMANCE STATUS: 0  . Vitals:   09/07/23 0912  BP: 115/82  Pulse: 75  Resp: 17  Temp: 97.7 F (36.5 C)  SpO2: 98%   Filed Weights   09/07/23 0912  Weight: 188 lb 4.8 oz (85.4 kg)   .Body mass index is 28.63 kg/m. GENERAL:alert, in no acute distress and comfortable SKIN: no acute rashes, no significant lesions EYES: conjunctiva are pink and non-injected, sclera anicteric OROPHARYNX: MMM, no exudates, no oropharyngeal erythema or ulceration NECK: supple, no JVD LYMPH:  no palpable  lymphadenopathy in the cervical, axillary or inguinal regions LUNGS: clear to auscultation b/l with normal respiratory effort HEART: regular rate & rhythm ABDOMEN:  normoactive bowel sounds , non tender, not distended. Extremity: no pedal edema PSYCH: alert & oriented x 3 with fluent speech NEURO: no focal motor/sensory deficits    LABORATORY DATA:  I have reviewed the data as listed     Latest Ref Rng & Units 09/07/2023    8:15 AM 09/01/2022    8:36 AM 08/04/2021    2:52 PM  CBC  WBC 4.0 - 10.5 K/uL 4.9  4.4  7.1   Hemoglobin 13.0 - 17.0 g/dL 82.8  83.8  85.4   Hematocrit 39.0 - 52.0 % 52.3  48.1  45.0   Platelets 150 - 400 K/uL 162  167  179        Latest Ref Rng & Units 09/01/2022     8:36 AM 05/26/2022    8:37 AM 08/04/2021    2:52 PM  CMP  Glucose 70 - 99 mg/dL 889   92   BUN 6 - 20 mg/dL 18   15   Creatinine 9.38 - 1.24 mg/dL 8.85  8.69  8.87   Sodium 135 - 145 mmol/L 141   139   Potassium 3.5 - 5.1 mmol/L 4.2   4.1   Chloride 98 - 111 mmol/L 107   106   CO2 22 - 32 mmol/L 30   28   Calcium 8.9 - 10.3 mg/dL 8.9   9.4   Total Protein 6.5 - 8.1 g/dL 6.3   7.2   Total Bilirubin 0.3 - 1.2 mg/dL 0.4   0.3   Alkaline Phos 38 - 126 U/L 59   61   AST 15 - 41 U/L 16   17   ALT 0 - 44 U/L 14   16     Lab Results  Component Value Date   LDH 121 09/01/2022   Component     Latest Ref Rng & Units 08/04/2021  Hep B Core Total Ab     NON REACTIVE NON REACTIVE  Hepatitis B Surface Ag     NON REACTIVE NON REACTIVE  Sed Rate     0 - 16 mm/hr 5  HIV Screen 4th Generation wRfx     Non Reactive Non Reactive  HCV Ab     NON REACTIVE NON REACTIVE    RADIOGRAPHIC STUDIES: I have personally reviewed the radiological images as listed and agreed with the findings in the report. No results found.   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 This was incidentally picked up during a routine colonoscopy and polypectomy. No visible abnormalities in the colonic mucosa reported on colonoscopy. Patient has no focal GI symptoms. No constitutional symptoms.  PLAN  -Discussed lab results on 09/07/2023 in detail with patient. CBC showed WBC of 4.9K, hemoglobin of 17.1, and platelets of 162K. -patient is polycythemic today, possibly due to dehydration or genetic changes in the bone marrow -his other blood counts are normal -LDH normal -CMP does not show dehydration based on kidney numbers -educated patient that factors that cause oxygen levels to be low can cause genetic changes in the bone marrow -advised patient to stay well hydrated -recommend rechecking labs in couple of weeks after  optimizing hydration. If his RBCs are still high despite water optimization, there may be a role for genetic testing  -educated patient that  elevated RBCs can increase the risk of blood clots -there is no clinical progression of lymphoma based on labs at this time -would recommend patient to have a colonoscopy in 1-2 years due to hx of abnormal infiltrate concerns -patient is aware to monitor for and contact us  if he experiences any new localized symptoms -patient shall return to clinic in 1 year with labs  Follow-up Labs in 2 weeks for polycythemia w/u Phone visit with Dr Onesimo in 4 weeks RTC with Dr Onesimo with labs in 12 months  The total time spent in the appointment was 30 minutes* .  All of the patient's questions were answered with apparent satisfaction. The patient knows to call the clinic with any problems, questions or concerns.   Emaline Onesimo MD MS AAHIVMS [facility] Hematology/Oncology Physician [facility]  .*Total Encounter Time as defined by the Centers for Medicare and Medicaid Services includes, in addition to the face-to-face time of a patient visit (documented in the note above) non-face-to-face time: obtaining and reviewing outside history, ordering and reviewing medications, tests or procedures, care coordination (communications with other health care professionals or caregivers) and documentation in the medical record.    I,Mitra Faeizi,acting as a neurosurgeon for Emaline Onesimo, MD.,have documented all relevant documentation on the behalf of Emaline Onesimo, MD,as directed by  Emaline Onesimo, MD while in the presence of Emaline Onesimo, MD.  .I have reviewed the above documentation for accuracy and completeness, and I agree with the above. .Odaly Peri Kishore Agnieszka Newhouse MD

## 2023-09-07 ENCOUNTER — Inpatient Hospital Stay: Payer: BC Managed Care – PPO | Attending: Internal Medicine

## 2023-09-07 ENCOUNTER — Inpatient Hospital Stay (HOSPITAL_BASED_OUTPATIENT_CLINIC_OR_DEPARTMENT_OTHER): Payer: BC Managed Care – PPO | Admitting: Hematology

## 2023-09-07 VITALS — BP 115/82 | HR 75 | Temp 97.7°F | Resp 17 | Wt 188.3 lb

## 2023-09-07 DIAGNOSIS — C8590 Non-Hodgkin lymphoma, unspecified, unspecified site: Secondary | ICD-10-CM

## 2023-09-07 DIAGNOSIS — D479 Neoplasm of uncertain behavior of lymphoid, hematopoietic and related tissue, unspecified: Secondary | ICD-10-CM | POA: Diagnosis present

## 2023-09-07 DIAGNOSIS — Z860100 Personal history of colon polyps, unspecified: Secondary | ICD-10-CM | POA: Diagnosis not present

## 2023-09-07 DIAGNOSIS — D751 Secondary polycythemia: Secondary | ICD-10-CM

## 2023-09-07 DIAGNOSIS — K5900 Constipation, unspecified: Secondary | ICD-10-CM | POA: Insufficient documentation

## 2023-09-07 DIAGNOSIS — Z87891 Personal history of nicotine dependence: Secondary | ICD-10-CM | POA: Diagnosis not present

## 2023-09-07 DIAGNOSIS — R6883 Chills (without fever): Secondary | ICD-10-CM | POA: Insufficient documentation

## 2023-09-07 LAB — CBC WITH DIFFERENTIAL (CANCER CENTER ONLY)
Abs Immature Granulocytes: 0.03 10*3/uL (ref 0.00–0.07)
Basophils Absolute: 0 10*3/uL (ref 0.0–0.1)
Basophils Relative: 1 %
Eosinophils Absolute: 0.2 10*3/uL (ref 0.0–0.5)
Eosinophils Relative: 4 %
HCT: 52.3 % — ABNORMAL HIGH (ref 39.0–52.0)
Hemoglobin: 17.1 g/dL — ABNORMAL HIGH (ref 13.0–17.0)
Immature Granulocytes: 1 %
Lymphocytes Relative: 27 %
Lymphs Abs: 1.3 10*3/uL (ref 0.7–4.0)
MCH: 29 pg (ref 26.0–34.0)
MCHC: 32.7 g/dL (ref 30.0–36.0)
MCV: 88.6 fL (ref 80.0–100.0)
Monocytes Absolute: 0.5 10*3/uL (ref 0.1–1.0)
Monocytes Relative: 10 %
Neutro Abs: 2.8 10*3/uL (ref 1.7–7.7)
Neutrophils Relative %: 57 %
Platelet Count: 162 10*3/uL (ref 150–400)
RBC: 5.9 MIL/uL — ABNORMAL HIGH (ref 4.22–5.81)
RDW: 13.3 % (ref 11.5–15.5)
WBC Count: 4.9 10*3/uL (ref 4.0–10.5)
nRBC: 0 % (ref 0.0–0.2)

## 2023-09-07 LAB — CMP (CANCER CENTER ONLY)
ALT: 43 U/L (ref 0–44)
AST: 25 U/L (ref 15–41)
Albumin: 4.3 g/dL (ref 3.5–5.0)
Alkaline Phosphatase: 70 U/L (ref 38–126)
Anion gap: 6 (ref 5–15)
BUN: 15 mg/dL (ref 8–23)
CO2: 30 mmol/L (ref 22–32)
Calcium: 9.2 mg/dL (ref 8.9–10.3)
Chloride: 105 mmol/L (ref 98–111)
Creatinine: 1.15 mg/dL (ref 0.61–1.24)
GFR, Estimated: >60 mL/min (ref >60)
Glucose, Bld: 109 mg/dL — ABNORMAL HIGH (ref 70–99)
Potassium: 4.6 mmol/L (ref 3.5–5.1)
Sodium: 141 mmol/L (ref 135–145)
Total Bilirubin: 0.5 mg/dL (ref 0.0–1.2)
Total Protein: 7.1 g/dL (ref 6.5–8.1)

## 2023-09-07 LAB — LACTATE DEHYDROGENASE: LDH: 161 U/L (ref 98–192)

## 2023-09-10 ENCOUNTER — Telehealth: Payer: Self-pay | Admitting: Hematology

## 2023-09-10 NOTE — Telephone Encounter (Signed)
 Spoke with patient confirming upcoming appointment

## 2023-10-12 ENCOUNTER — Inpatient Hospital Stay: Payer: BC Managed Care – PPO | Admitting: Hematology

## 2023-11-20 NOTE — Progress Notes (Signed)
 This encounter was created in error - please disregard.

## 2024-09-19 ENCOUNTER — Inpatient Hospital Stay: Payer: BC Managed Care – PPO | Attending: Internal Medicine | Admitting: Hematology

## 2024-09-19 ENCOUNTER — Inpatient Hospital Stay: Payer: BC Managed Care – PPO
